# Patient Record
Sex: Female | Born: 1940 | Race: White | Hispanic: No | Smoking: Never smoker
Health system: Southern US, Community
[De-identification: ages and names within clinical notes are randomized; demographics above are authoritative.]

## PROBLEM LIST (undated history)

## (undated) HISTORY — PX: OTHER SURGICAL HISTORY: SHX169

---

## 2020-08-13 ENCOUNTER — Encounter: Payer: Self-pay | Admitting: Emergency Medicine

## 2020-08-13 ENCOUNTER — Other Ambulatory Visit: Payer: Self-pay

## 2020-08-13 ENCOUNTER — Emergency Department
Admission: EM | Admit: 2020-08-13 | Discharge: 2020-08-13 | Disposition: A | Discharge status: Home / Self Care | Payer: Medicare Other | Source: Home / Self Care | Attending: Emergency Medicine | Admitting: Emergency Medicine

## 2020-08-13 DIAGNOSIS — L03011 Cellulitis of right finger: Secondary | ICD-10-CM

## 2020-08-13 MED ORDER — DOXYCYCLINE HYCLATE 100 MG PO CAPS: 100.0000 mg | ORAL_CAPSULE | Freq: Two times a day (BID) | ORAL | 0 refills | Status: DC

## 2020-08-13 NOTE — ED Triage Notes (Signed)
Patient reports unusual nail growth on right middle finger over past month; she has been trimming it and yesterday it became reddened, edematous and excruciatingly painful; took ibuprofen 0200. Has not had covid vaccination.

## 2020-08-13 NOTE — ED Provider Notes (Addendum)
[facility]    CSN: [id number] Arrival date & time: 08/13/20  3579 79 year old female, new patient to [facility] urgent care, presents on Sunday, 08/13/2020     History   Chief Complaint Chief Complaint  Patient presents with  . Nail Problem    HPI Dominique Peterson is a 79 y.o. female.   HPI Patient noted unusual nail growth on right middle finger over the past month, but it was not particularly painful and did not have drainage or bleeding. Over the past week or so, she has been trimming it. Yesterday, noted right middle finger nail area became red and swollen and very painful.  No drainage.  No bleeding. She took ibuprofen early this morning which helped a little bit.  No other symptoms.  Denies fever or chills or nausea or vomiting. Denies history of diabetes. History reviewed. No pertinent past medical history.  There are no problems to display for this patient.   Past Surgical History:  Procedure Laterality Date  . goiter     excision    OB History   No obstetric history on file.      Home Medications    Prior to Admission medications   Medication Sig Start Date End Date Taking? Authorizing Provider  doxycycline (VIBRAMYCIN) 100 MG capsule Take 1 capsule (100 mg total) by mouth 2 (two) times daily. 08/13/20   Lajean Manes, MD    Family History History reviewed. No pertinent family history.  Social History Social History   Tobacco Use  . Smoking status: Never Smoker  . Smokeless tobacco: Never Used  Vaping Use  . Vaping Use: Never used  Substance Use Topics  . Alcohol use: Not on file  . Drug use: Not on file     Allergies   Patient has no known allergies.   Review of Systems Review of Systems  All other systems reviewed and are negative.    Physical Exam Triage Vital Signs ED Triage Vitals  Enc Vitals Group     BP 08/13/20 0833 (!) 162/88     Pulse Rate 08/13/20 0833 89     Resp 08/13/20 0833 16     Temp 08/13/20 0833  98.2 F (36.8 C)     Temp Source 08/13/20 0833 Oral     SpO2 08/13/20 0833 98 %     Weight 08/13/20 0835 170 lb (77.1 kg)     Height 08/13/20 0835 5\' 6"  (1.676 m)     Head Circumference --      Peak Flow --      Pain Score 08/13/20 0834 8     Pain Loc --      Pain Edu? --      Excl. in GC? --    No data found.  Updated Vital Signs BP (!) 162/88 (BP Location: Left Arm)   Pulse 89   Temp 98.2 F (36.8 C) (Oral)   Resp 16   Ht 5\' 6"  (1.676 m)   Wt 77.1 kg   SpO2 98%   BMI 27.44 kg/m   Physical Exam Vitals reviewed.  Constitutional:      General: She is not in acute distress.    Appearance: She is well-developed.  HENT:     Head: Normocephalic and atraumatic.  Eyes:     General: No scleral icterus.    Pupils: Pupils are equal, round, and reactive to light.  Cardiovascular:     Rate and Rhythm: Normal rate and regular rhythm.  Pulmonary:  Effort: Pulmonary effort is normal.  Abdominal:     General: There is no distension.  Musculoskeletal:   Distal aspect of soft tissues of the right third finger are red, swollen, firm and tender.  No fluctuance or drainage or bleeding.  Fingernail intact.  Tendons and range of motion intact.  No red streaks. Good capillary refill distally and neurovascular intact. Skin: Is otherwise within normal limits.  No rash.    General: Skin is warm and dry.  Neurological:     Mental Status: She is alert and oriented to person, place, and time.     Cranial Nerves: No cranial nerve deficit.  Sensation intact. Psychiatric:        Behavior: Behavior normal.    UC Treatments / Results  Labs (all labs ordered are listed, but only abnormal results are displayed) Labs Reviewed - No data to display  EKG   Radiology No results found.  Procedures Procedures (including critical care time)  Medications Ordered in UC Medications - No data to display  Initial Impression / Assessment and Plan / UC Course  I have reviewed the triage vital  signs and the nursing notes.  Pertinent labs & imaging results that were available during my care of the patient were reviewed by me and considered in my medical decision making (see chart for details).      Final Clinical Impressions(s) / UC Diagnoses   Final diagnoses:  Cellulitis of right middle finger  Discussed diagnosis and treatment options and patient agrees with discharge instructions as detailed below. There is no fluctuance or sign of abscess, and therefore no need to attempt incision and drainage at this time.  Explained that if there are any worsening or sign of an abscess, to seek medical care immediately and she might need incision and drainage. I have selected doxycycline as antibiotic with excellent staphylococcal and MRSA coverage.  Also, advised to follow-up with PCP within the next 2 to 3 weeks to have BP rechecked.  Precautions discussed. Red flags discussed. Questions invited and answered. Patient voiced understanding and agreement.    Discharge Instructions     You have an infection of the skin/soft tissues of the right third finger.  It is not at the point where we could lance it because it is only infection and firmness in the tissues.  It has not become a boil or come to ahead yet. Treatment: Sent prescription for doxycycline, strong antibiotic to your pharmacy.  Take as directed. Do soaks of finger and Epson salt/warm water for 10 minutes, 3 times a day. Tylenol or ibuprofen if needed for pain. Follow-up with your PCP within 4 to 5 days for recheck, sooner if worse or new symptoms. If your finger becomes severely worse or it feels like a boil is forming, or if you have fever or chills or nausea or vomiting, go to emergency room or urgent care immediately.     ED Prescriptions    Medication Sig Dispense Auth. Provider   doxycycline (VIBRAMYCIN) 100 MG capsule Take 1 capsule (100 mg total) by mouth 2 (two) times daily. 20 capsule Lajean Manes, MD      PDMP not reviewed this encounter.   Lajean Manes, MD 08/14/20 1448    Lajean Manes, MD 08/14/20 [phone]

## 2020-08-13 NOTE — Discharge Instructions (Signed)
You have an infection of the skin/soft tissues of the right third finger.  It is not at the point where we could lance it because it is only infection and firmness in the tissues.  It has not become a boil or come to ahead yet. Treatment: Sent prescription for doxycycline, strong antibiotic to your pharmacy.  Take as directed. Do soaks of finger and Epson salt/warm water for 10 minutes, 3 times a day. Tylenol or ibuprofen if needed for pain. Follow-up with your PCP within 4 to 5 days for recheck, sooner if worse or new symptoms. If your finger becomes severely worse or it feels like a boil is forming, or if you have fever or chills or nausea or vomiting, go to emergency room or urgent care immediately.

## 2020-08-29 ENCOUNTER — Emergency Department (INDEPENDENT_AMBULATORY_CARE_PROVIDER_SITE_OTHER): Payer: Medicare Other

## 2020-08-29 ENCOUNTER — Encounter: Payer: Self-pay | Admitting: Emergency Medicine

## 2020-08-29 ENCOUNTER — Emergency Department
Admission: EM | Admit: 2020-08-29 | Discharge: 2020-08-29 | Disposition: A | Discharge status: Home / Self Care | Payer: Medicare Other | Source: Home / Self Care | Attending: Family Medicine | Admitting: Family Medicine

## 2020-08-29 DIAGNOSIS — M25541 Pain in joints of right hand: Secondary | ICD-10-CM | POA: Diagnosis not present

## 2020-08-29 DIAGNOSIS — M19041 Primary osteoarthritis, right hand: Secondary | ICD-10-CM | POA: Diagnosis not present

## 2020-08-29 NOTE — ED Triage Notes (Signed)
Pt seen on 8/29 for infection to  r middle finger- finished antibiotics last Tuesday Swelling was going down, but is increasing now Pt took a shower earlier today and noted a blister below nail Tender to touch - slightly red Denies fever or chills NO COVID vaccine

## 2020-08-29 NOTE — ED Provider Notes (Signed)
[facility]    CSN: [id number] Arrival date & time: 08/29/20  1447      History   Chief Complaint No chief complaint on file.   HPI Dominique Peterson is a 79 y.o. female.   Patient was treated for cellulitis of her right middle finger 16 days ago with doxycycline for 10 days.  She reports that swelling and pain decreased but still persist at the distal finger and DIP joint.  She took a shower today and noted a "blister" below her fingernail.  She feels well otherwise and denies fevers, chills, and sweats.  The history is provided by the patient.  Hand Pain This is a recurrent problem. Episode onset: 6 to 8 weeks ago. The problem occurs constantly. The problem has not changed since onset.Exacerbated by: finger movement. Nothing relieves the symptoms. Treatments tried: antibiotic. The treatment provided mild relief.    History reviewed. No pertinent past medical history.  There are no problems to display for this patient.   Past Surgical History:  Procedure Laterality Date  . goiter     excision    OB History   No obstetric history on file.      Home Medications    Prior to Admission medications   Medication Sig Start Date End Date Taking? Authorizing Provider  doxycycline (VIBRAMYCIN) 100 MG capsule Take 1 capsule (100 mg total) by mouth 2 (two) times daily. 08/13/20   Lajean Manes, MD    Family History Family History  Problem Relation Age of Onset  . Heart failure Mother     Social History Social History   Tobacco Use  . Smoking status: Never Smoker  . Smokeless tobacco: Never Used  Vaping Use  . Vaping Use: Never used  Substance Use Topics  . Alcohol use: Never  . Drug use: Never     Allergies   Patient has no known allergies.   Review of Systems Review of Systems  Constitutional: Negative for appetite change, chills, diaphoresis, fatigue and fever.  Musculoskeletal: Positive for joint swelling.  Skin: Positive for color change.  All  other systems reviewed and are negative.    Physical Exam Triage Vital Signs ED Triage Vitals  Enc Vitals Group     BP 08/29/20 1558 (!) 156/79     Pulse Rate 08/29/20 1558 70     Resp 08/29/20 1558 17     Temp 08/29/20 1558 99.6 F (37.6 C)     Temp Source 08/29/20 1558 Oral     SpO2 08/29/20 1558 98 %     Weight 08/29/20 1604 170 lb 3.1 oz (77.2 kg)     Height 08/29/20 1604 5\' 6"  (1.676 m)     Head Circumference --      Peak Flow --      Pain Score 08/29/20 1603 2     Pain Loc --      Pain Edu? --      Excl. in GC? --    No data found.  Updated Vital Signs BP (!) 156/79 (BP Location: Left Arm)   Pulse 70   Temp 99.6 F (37.6 C) (Oral)   Resp 17   Ht 5\' 6"  (1.676 m)   Wt 77.2 kg   SpO2 98%   BMI 27.47 kg/m   Visual Acuity Right Eye Distance:   Left Eye Distance:   Bilateral Distance:    Right Eye Near:   Left Eye Near:    Bilateral Near:     Physical  Exam Vitals and nursing note reviewed.  Constitutional:      General: She is not in acute distress. HENT:     Head: Normocephalic.  Eyes:     Conjunctiva/sclera: Conjunctivae normal.     Pupils: Pupils are equal, round, and reactive to light.  Cardiovascular:     Rate and Rhythm: Normal rate.  Pulmonary:     Effort: Pulmonary effort is normal.  Musculoskeletal:        General: Tenderness present.     Right hand: Swelling, tenderness and bony tenderness present. Decreased range of motion.       Hands:     Comments: Right 3rd finger DIP joint erythematous and tender to palpation.  Decreased range of motion DIP joint.  Distal neurovascular function is intact.   Skin:    General: Skin is warm and dry.  Neurological:     Mental Status: She is alert.      UC Treatments / Results  Labs (all labs ordered are listed, but only abnormal results are displayed) Labs Reviewed - No data to display  EKG   Radiology DG Finger Middle Right  Result Date: 08/29/2020 CLINICAL DATA:  Persistent tenderness  and pain and D IP for 1 month EXAM: RIGHT MIDDLE FINGER 2+V COMPARISON:  None. FINDINGS: Subtle fractures involving the base of the third distal phalanx as well as the head of the middle phalanx albeit with complete effacement of the joint space and additional subcortical lucency and swelling. While this could be posttraumatic edematous changes, septic arthritis/osteomyelitis is not excluded given the surrounding swelling. No soft tissue gas or foreign body. IMPRESSION: Subtle fractures involving the base of the third distal phalanx as well as the head of the middle phalanx albeit Complete effacement of the distal interphalangeal joint space and additional subcortical lucency and swelling, possibly posttraumatic though should exclude features of infection clinically as septic arthritis/osteomyelitis could present similarly. Electronically Signed   By: Kreg Shropshire M.D.   On: 08/29/2020 17:49    Procedures Procedures (including critical care time)  Medications Ordered in UC Medications - No data to display  Initial Impression / Assessment and Plan / UC Course  I have reviewed the triage vital signs and the nursing notes.  Pertinent labs & imaging results that were available during my care of the patient were reviewed by me and considered in my medical decision making (see chart for details).    X-ray right middle finger:  Subtle fractures involving the base of the third distal phalanx as well as the head of the middle phalanx albeit with complete effacement of the joint space and additional subcortical lucency and swelling.  Findings are concerning for possible septic arthritis/osteomyelitis.  Patient declines referral to hand surgeon in [place] and wishes to be further evaluated in [place].  Schedule appointment with Dr. Rodney Langton ([facility]) for further evaluation as soon as possible.    Final Clinical Impressions(s) / UC Diagnoses   Final diagnoses:    Inflammation of joint of finger of right hand     Discharge Instructions     If symptoms become significantly worse during the night or over the weekend, proceed to the local emergency room.     ED Prescriptions    None        Lattie Haw, MD 08/31/20 1147

## 2020-08-29 NOTE — Discharge Instructions (Addendum)
If symptoms become significantly worse during the night or over the weekend, proceed to the local emergency room.  

## 2020-09-04 ENCOUNTER — Ambulatory Visit (INDEPENDENT_AMBULATORY_CARE_PROVIDER_SITE_OTHER): Payer: Medicare Other | Admitting: Sports Medicine

## 2020-09-04 ENCOUNTER — Encounter: Payer: Self-pay | Admitting: Sports Medicine

## 2020-09-04 DIAGNOSIS — M7989 Other specified soft tissue disorders: Secondary | ICD-10-CM | POA: Insufficient documentation

## 2020-09-04 MED ORDER — SULFAMETHOXAZOLE-TRIMETHOPRIM 800-160 MG PO TABS: 1.0000 | ORAL_TABLET | Freq: Two times a day (BID) | ORAL | 0 refills | Status: DC

## 2020-09-04 NOTE — Progress Notes (Signed)
    Procedures performed today:    None.  Independent interpretation of notes and tests performed by another provider:   None.  Brief History, Exam, Impression, and Recommendations:    Dominique Peterson is a pleasant 79yo female who has been having about 3 weeks of swelling at there right middle DIP. It began when she pulled a hangnail. She was initially started on doxycycline for cellulitis. On reevaluation xray demonstrated possible osteomyelitis. We are going to get a CBC, ESR, and CRP to evaluate for osteomyelitis. With the potential for an MRI afterwards if these do not help to confirm the diagnosis. We are also going to give her septra while we work on the diagnosis. We discussed the potential to need to see infecitous deisease for IV antibiotics if osteomyelitis is confrimed.   Marcelino Duster, MS3   ___________________________________________ Gwen Her. Dianah Field, M.D., ABFM., CAQSM. Primary Care and [place] Instructor of [place] [employer] [facility]

## 2020-09-04 NOTE — Assessment & Plan Note (Addendum)
This is a pleasant 79 year old female, for almost a month now she is had swelling in her right 3rd DIP, this started with trying to dig out her cuticle.  She was seen in urgent care twice and given a few courses of antibiotics, ultimately x-rays showed potential destruction of her DIP consistent with osteomyelitis. We do need to confirm this diagnosis so I am going to add CBC, CMP, ESR, CRP. If her inflammatory markers are elevated we will likely proceed with referral to infectious disease and potential MRI with and without contrast of her finger. I am going to start Septra in the meantime as she has already had a few weeks of doxycycline. I did inform her that there is potential for amputation and the need for 6 weeks of IV antibiotics.  Labs look okay in terms of white blood cell count, sed rate and CRP, because of these normal values I am going to pull the trigger for finger MRI with and without contrast looking for osteomyelitis.

## 2020-09-05 LAB — COMPLETE METABOLIC PANEL WITH GFR
AG Ratio: 1.6 (calc) (ref 1.0–2.5)
ALT: 16 U/L (ref 6–29)
AST: 18 U/L (ref 10–35)
Albumin: 4.3 g/dL (ref 3.6–5.1)
Alkaline phosphatase (APISO): 94 U/L (ref 37–153)
BUN: 18 mg/dL (ref 7–25)
CO2: 30 mmol/L (ref 20–32)
Calcium: 9.6 mg/dL (ref 8.6–10.4)
Chloride: 103 mmol/L (ref 98–110)
Creat: 0.89 mg/dL (ref 0.60–0.93)
GFR, Est African American: 72 mL/min/{1.73_m2} (ref >=60)
GFR, Est Non African American: 62 mL/min/{1.73_m2} (ref >=60)
Globulin: 2.7 g/dL (calc) (ref 1.9–3.7)
Glucose, Bld: 99 mg/dL (ref 65–139)
Potassium: 4.3 mmol/L (ref 3.5–5.3)
Sodium: 141 mmol/L (ref 135–146)
Total Bilirubin: 0.4 mg/dL (ref 0.2–1.2)
Total Protein: 7 g/dL (ref 6.1–8.1)

## 2020-09-05 LAB — CBC WITH DIFFERENTIAL/PLATELET
Absolute Monocytes: 483 cells/uL (ref 200–950)
Basophils Absolute: 70 cells/uL (ref 0–200)
Basophils Relative: 1 %
Eosinophils Absolute: 161 cells/uL (ref 15–500)
Eosinophils Relative: 2.3 %
HCT: 38 % (ref 35.0–45.0)
Hemoglobin: 12.8 g/dL (ref 11.7–15.5)
Lymphs Abs: 1624 cells/uL (ref 850–3900)
MCH: 30.3 pg (ref 27.0–33.0)
MCHC: 33.7 g/dL (ref 32.0–36.0)
MCV: 90 fL (ref 80.0–100.0)
MPV: 10.8 fL (ref 7.5–12.5)
Monocytes Relative: 6.9 %
Neutro Abs: 4662 cells/uL (ref 1500–7800)
Neutrophils Relative %: 66.6 %
Platelets: 275 10*3/uL (ref 140–400)
RBC: 4.22 10*6/uL (ref 3.80–5.10)
RDW: 13.4 % (ref 11.0–15.0)
Total Lymphocyte: 23.2 %
WBC: 7 10*3/uL (ref 3.8–10.8)

## 2020-09-05 LAB — C-REACTIVE PROTEIN: CRP: 2.6 mg/L (ref <8.0)

## 2020-09-05 LAB — SEDIMENTATION RATE: Sed Rate: 25 mm/h (ref 0–30)

## 2020-09-05 NOTE — Addendum Note (Signed)
Addended by: Monica Becton on: 09/05/2020 08:41 AM   Modules accepted: Orders

## 2020-09-11 ENCOUNTER — Telehealth: Payer: Self-pay

## 2020-09-11 MED ORDER — TRIAZOLAM 0.25 MG PO TABS: ORAL_TABLET | ORAL | 0 refills | Status: DC

## 2020-09-11 NOTE — Telephone Encounter (Signed)
Done

## 2020-09-11 NOTE — Telephone Encounter (Signed)
Patient needs pre med for MRI.

## 2020-09-12 NOTE — Telephone Encounter (Signed)
Left msg advising RX at pharmacy and that would need driver

## 2020-09-18 ENCOUNTER — Ambulatory Visit (INDEPENDENT_AMBULATORY_CARE_PROVIDER_SITE_OTHER): Payer: Medicare Other

## 2020-09-18 ENCOUNTER — Other Ambulatory Visit: Payer: Self-pay

## 2020-09-18 DIAGNOSIS — L03011 Cellulitis of right finger: Secondary | ICD-10-CM | POA: Diagnosis not present

## 2020-09-18 MED ORDER — GADOBUTROL 1 MMOL/ML IV SOLN
7.5000 mL | Freq: Once | INTRAVENOUS | Status: AC | PRN
  Administered 2020-09-18: 7.5 mL via INTRAVENOUS

## 2020-09-18 NOTE — Addendum Note (Signed)
Addended by: Monica Becton on: 09/18/2020 02:10 PM   Modules accepted: Orders

## 2020-09-21 DIAGNOSIS — M869 Osteomyelitis, unspecified: Secondary | ICD-10-CM | POA: Insufficient documentation

## 2020-09-21 NOTE — Progress Notes (Signed)
[place] for Infectious Disease  Reason for Consult: Osteomyelitis of the finger Referring Provider: Dr Dianah Field  HPI:  Dominique Peterson is Dominique 79 y.o. female who presents to clinic for further evaluation of right finger osteomyelitis.     Patient is Dominique Peterson who presents to clinic today for further evaluation of an infection of her right third finger.  She began having issues with this finger in August when she noted unusual nail growth.  This did not have any particular pain, drainage or bleeding.  She tried to manage by trimming her own nails.  She was seen in urgent care visit on August 29 after noticing redness and swelling and pain for about 1 day.  Her symptoms were mildly relieved with ibuprofen at that time.  She was given doxycycline for 10 days at that time to treat Dominique superficial cellulitis of her right middle finger.  There was no imaging done at that time.  There were no labs done at that time.  She presented again to the same urgent care on September 14 and noted that the swelling and pain had decreased but was still persistent at the distal finger and DIP joint.  At that visit she noted Dominique blister below her fingernail as well.  X-ray completed at that urgent care visit showed findings that were concerning for possible septic arthritis or osteomyelitis.  Patient declined referral to hand surgery in [place] and wished to remain closer to home in [place].  She was seen in the sports medicine clinic on September 20 and was seen by primary care sports medicine provider (Dr. Dianah Field).  Interestingly, her white blood cell count was normal, CMP normal, ESR and CRP also normal.  She was given TMP-SMX at this visit for Dominique 7-day course which she completed.  The TMP-SMX did provide some improvement.  Given her normal lab findings it was arranged for her to have an MRI of her fingers with and without contrast.  The results of this imaging done October 4 were motion  graded, however, there was bone marrow edema within the middle and distal phalanx of the right long finger centered at the distal interphalangeal joint.  Subtle low T1 signal change suspected at the base of the distal phalanx which might reflect acute osteomyelitis.  There was soft tissue edema at the distal aspect of the long finger, but no soft tissue fluid collection.  Lastly, trace synovial fluid associated with the flexor tendon of the long finger suggesting Dominique mild tenosynovitis was noted.  She has been referred to infectious disease for further evaluation and presents today.  Patient's Medications  New Prescriptions   No medications on file  Previous Medications   No medications on file  Modified Medications   No medications on file  Discontinued Medications   SULFAMETHOXAZOLE-TRIMETHOPRIM (BACTRIM DS) 800-160 MG TABLET    Take 1 tablet by mouth 2 (two) times daily.   TRIAZOLAM (HALCION) 0.25 MG TABLET    1-2 tabs PO 2 hours before procedure or imaging.  Do not drive with this medication.      No pertinent past medical history.  Social History   Tobacco Use  . Smoking status: Never Smoker  . Smokeless tobacco: Never Used  Vaping Use  . Vaping Use: Never used  Substance Use Topics  . Alcohol use: Never  . Drug use: Never    Family History  Problem Relation Age of Onset  . Heart failure Mother  No Known Allergies  Review of Systems: Review of Systems  Constitutional: Negative for chills, fever and malaise/fatigue.  Respiratory: Negative for cough and shortness of breath.   Cardiovascular: Negative for chest pain.  Gastrointestinal: Negative for abdominal pain, nausea and vomiting.  Musculoskeletal: Positive for joint pain.  Skin: Negative for rash.  All other systems reviewed and are negative.   Objective: Vitals:   09/22/20 0903  BP: (!) 183/75  Pulse: 67  Temp: 98.2 F (36.8 C)  TempSrc: Oral  Weight: 176 lb (79.8 kg)     Body mass index is 28.41  kg/m.  Physical Exam Vitals reviewed.  Constitutional:      General: She is not in acute distress.    Appearance: Normal appearance.  HENT:     Head: Normocephalic and atraumatic.  Pulmonary:     Effort: Pulmonary effort is normal.  Musculoskeletal:        General: Swelling present.     Comments: Right 3rd finger is swollen at the DIP joint with some mild erythema.  No drainage.  She has decreased ROM at the DIP as well.  No significant pain, more pressure sensation.  Neurological:     Mental Status: She is alert.     Right hand with 3rd digit swelling.     Left hand for comparison  Pertinent Labs and Microbiology: ESR 25, CRP 2.6  CBC Latest Ref Rng & Units 09/04/2020  WBC 3.8 - 10.8 Thousand/uL 7.0  Hemoglobin 11.7 - 15.5 g/dL 12.8  Hematocrit 35 - 45 % 38.0  Platelets 140 - 400 Thousand/uL 275   CMP Latest Ref Rng & Units 09/04/2020  Glucose 65 - 139 mg/dL 99  BUN 7 - 25 mg/dL 18  Creatinine 0.60 - 0.93 mg/dL 0.89  Sodium 135 - 146 mmol/L 141  Potassium 3.5 - 5.3 mmol/L 4.3  Chloride 98 - 110 mmol/L 103  CO2 20 - 32 mmol/L 30  Calcium 8.6 - 10.4 mg/dL 9.6  Total Protein 6.1 - 8.1 g/dL 7.0  Total Bilirubin 0.2 - 1.2 mg/dL 0.4  AST 10 - 35 U/L 18  ALT 6 - 29 U/L 16     Imaging: IMPRESSION: 1. Motion degraded examination. 2. Bone marrow edema within the middle and distal phalanx of the right long finger centered at the distal interphalangeal joint. Subtle low T1 signal changes suspected at the base of the distal phalanx which may reflect acute osteomyelitis. 3. Soft tissue edema at the distal aspect of the long finger. No soft tissue fluid collection. 4. Trace tenosynovial fluid associated with the flexor tendon of the long finger suggesting Dominique mild tenosynovitis.  All pertinent labs/notes reviewed.  All pertinent radiology reports have been reviewed.   Decision making incorporated into the Assessment and Plan.  Assessment and Plan: Finger  osteomyelitis, right ([place]) She has MRI findings of osteomyelitis and tenosynovitis of her right third finger.  Interestingly, her CBC and inflammatory markers are normal.  I am concerned about delay in her care if this is attempted as an outpatient due to the risk this poses to her finger on her dominant hand and have recommended inpatient admission for expedited surgical evaluation and IV antibiotics.  Plan: -- discussed with hospitalist who agrees to admit patient -- also discussed with hand surgery who will see patient when arrives -- if going to the OR soon can likely hold antibiotics until after cultures obtained and then start Vancomycin post-operatively.  She last took TMP-SMX on about 9/27 -- I will  repeat labs here today   Hypertension BP elevated here today without symptoms.  Appears to have been high during previous encounters as well.  Not currently on any anti-hypertensive therapy.  Plan: -- Defer to inpatient management and primary care    Orders Placed This Encounter  Procedures  . Blood culture (routine single)  . Blood culture (routine single)  . CBC w/Diff  . Comp Met (CMET)  . Sedimentation rate  . C-reactive protein  . HgB A1c  . HIV Antibody (routine testing w rflx)      I spent greater than 60 minutes with the patient including greater than 50% of time in face to face counsel of the patient and in coordination of their care.   Raynelle [facility] Infectious [facility] 09/22/2020, 10:33 AM

## 2020-09-22 ENCOUNTER — Ambulatory Visit: Payer: Medicare Other | Admitting: Internal Medicine

## 2020-09-22 ENCOUNTER — Telehealth: Payer: Self-pay

## 2020-09-22 ENCOUNTER — Other Ambulatory Visit: Payer: Self-pay

## 2020-09-22 ENCOUNTER — Observation Stay (HOSPITAL_COMMUNITY)
Admission: RE | Admit: 2020-09-22 | Discharge: 2020-09-23 | Disposition: A | Discharge status: Home / Self Care | Payer: Medicare Other | Attending: Internal Medicine | Admitting: Internal Medicine

## 2020-09-22 ENCOUNTER — Inpatient Hospital Stay (HOSPITAL_COMMUNITY): Payer: Medicare Other

## 2020-09-22 ENCOUNTER — Encounter (HOSPITAL_COMMUNITY): Payer: Self-pay | Admitting: Internal Medicine

## 2020-09-22 ENCOUNTER — Encounter: Payer: Self-pay | Admitting: Internal Medicine

## 2020-09-22 VITALS — BP 183/75 | HR 67 | Temp 98.2°F | Wt 176.0 lb

## 2020-09-22 DIAGNOSIS — M869 Osteomyelitis, unspecified: Secondary | ICD-10-CM | POA: Diagnosis not present

## 2020-09-22 DIAGNOSIS — I10 Essential (primary) hypertension: Secondary | ICD-10-CM

## 2020-09-22 DIAGNOSIS — L089 Local infection of the skin and subcutaneous tissue, unspecified: Secondary | ICD-10-CM | POA: Insufficient documentation

## 2020-09-22 DIAGNOSIS — B999 Unspecified infectious disease: Secondary | ICD-10-CM

## 2020-09-22 DIAGNOSIS — Z20822 Contact with and (suspected) exposure to covid-19: Secondary | ICD-10-CM | POA: Diagnosis not present

## 2020-09-22 DIAGNOSIS — R609 Edema, unspecified: Secondary | ICD-10-CM

## 2020-09-22 DIAGNOSIS — M7989 Other specified soft tissue disorders: Principal | ICD-10-CM | POA: Insufficient documentation

## 2020-09-22 HISTORY — DX: Unspecified infectious disease: B99.9

## 2020-09-22 LAB — RESPIRATORY PANEL BY RT PCR (FLU A&B, COVID)
Influenza A by PCR: NEGATIVE
Influenza B by PCR: NEGATIVE
SARS Coronavirus 2 by RT PCR: NEGATIVE

## 2020-09-22 LAB — BASIC METABOLIC PANEL
Anion gap: 11 (ref 5–15)
BUN: 12 mg/dL (ref 8–23)
CO2: 24 mmol/L (ref 22–32)
Calcium: 9 mg/dL (ref 8.9–10.3)
Chloride: 103 mmol/L (ref 98–111)
Creatinine, Ser: 0.91 mg/dL (ref 0.44–1.00)
GFR, Estimated: >60 mL/min (ref >60)
Glucose, Bld: 101 mg/dL — ABNORMAL HIGH (ref 70–99)
Potassium: 3.6 mmol/L (ref 3.5–5.1)
Sodium: 138 mmol/L (ref 135–145)

## 2020-09-22 LAB — SEDIMENTATION RATE: Sed Rate: 13 mm/hr (ref 0–22)

## 2020-09-22 LAB — C-REACTIVE PROTEIN: CRP: 0.5 mg/dL (ref <1.0)

## 2020-09-22 MED ORDER — POLYETHYLENE GLYCOL 3350 17 G PO PACK: 17.0000 g | PACK | Freq: Every day | ORAL | Status: DC | PRN

## 2020-09-22 MED ORDER — VANCOMYCIN HCL 1500 MG/300ML IV SOLN
1500.0000 mg | Freq: Once | INTRAVENOUS | Status: AC
  Administered 2020-09-22: 1500 mg via INTRAVENOUS
  Filled 2020-09-22: qty 300

## 2020-09-22 MED ORDER — ONDANSETRON HCL 4 MG PO TABS: 4.0000 mg | ORAL_TABLET | Freq: Four times a day (QID) | ORAL | Status: DC | PRN

## 2020-09-22 MED ORDER — SODIUM CHLORIDE 0.9% FLUSH
3.0000 mL | Freq: Two times a day (BID) | INTRAVENOUS | Status: DC
  Administered 2020-09-22 – 2020-09-23 (×2): 3 mL via INTRAVENOUS

## 2020-09-22 MED ORDER — VANCOMYCIN HCL 750 MG/150ML IV SOLN
750.0000 mg | Freq: Two times a day (BID) | INTRAVENOUS | Status: DC
  Administered 2020-09-23: 750 mg via INTRAVENOUS
  Filled 2020-09-22 (×2): qty 150

## 2020-09-22 MED ORDER — HYDROCODONE-ACETAMINOPHEN 5-325 MG PO TABS: 1.0000 | ORAL_TABLET | ORAL | Status: DC | PRN

## 2020-09-22 MED ORDER — ACETAMINOPHEN 650 MG RE SUPP: 650.0000 mg | Freq: Four times a day (QID) | RECTAL | Status: DC | PRN

## 2020-09-22 MED ORDER — ONDANSETRON HCL 4 MG/2ML IJ SOLN: 4.0000 mg | Freq: Four times a day (QID) | INTRAMUSCULAR | Status: DC | PRN

## 2020-09-22 MED ORDER — SODIUM CHLORIDE 0.9 % IV SOLN: 250.0000 mL | INTRAVENOUS | Status: DC | PRN

## 2020-09-22 MED ORDER — LABETALOL HCL 5 MG/ML IV SOLN: 10.0000 mg | INTRAVENOUS | Status: DC | PRN

## 2020-09-22 MED ORDER — ACETAMINOPHEN 325 MG PO TABS: 650.0000 mg | ORAL_TABLET | Freq: Four times a day (QID) | ORAL | Status: DC | PRN

## 2020-09-22 MED ORDER — SODIUM CHLORIDE 0.9% FLUSH: 3.0000 mL | INTRAVENOUS | Status: DC | PRN

## 2020-09-22 NOTE — Progress Notes (Signed)
Pharmacy Antibiotic Note  Dominique Peterson is a 79 y.o. female admitted on 09/22/2020 with osteomyelitis of R third finger (MRI findings of osteo, tenosynovitis).  Pharmacy has been consulted for vancomycin dosing.  WBC 5.3, afebrile, Scr 0.91, CrCl 53.4 ml/min  Plan: Vancomycin 1500 mg IV X 1, followed by vancomycin 750 mg IV Q 12 hrs (goal vancomycin trough level: 15-20 mg/L) Monitor WBC, temp, clinical improvement,cultures, renal function, vancomycin levels  Temp (24hrs), Avg:98.5 F (36.9 C), Min:98.2 F (36.8 C), Max:98.8 F (37.1 C)  Recent Labs  Lab 09/22/20 1044  WBC 5.3    Estimated Creatinine Clearance: 54.6 mL/min (by C-G formula based on SCr of 0.89 mg/dL).    No Known Allergies  Microbiology results: 10/8 BCx X 1: pending  Thank you for allowing pharmacy to be a part of this patient's care.  Vicki Mallet, PharmD, BCPS, [facility] Clinical Pharmacist 09/22/2020 8:53 PM

## 2020-09-22 NOTE — H&P (Signed)
History and Physical    Tinley Rought RRN:[id number] DOB: 05/21/1941 DOA: 09/22/2020  PCP: Patient, No Pcp Per   Patient coming from: Home   Chief Complaint: Right 3rd finger swelling and redness   HPI: Dominique Peterson is a 79 y.o. female who describes herself as generally healthy and does not take any prescription medications, now presenting as a direct admission from the ID clinic for swelling and redness of the third right finger with MRI findings concerning for osteomyelitis and tenosynovitis.  Patient reports that she had some unusual fingernail growth involving the third right finger back in late August, trimmed away this piece of nail at home, and noted some scant bloody and yellow drainage at that time.  This was followed by marked swelling and erythema of the distal digit for which she was seen at an urgent care and given a 10-day course of doxycycline.  She had some improvement in swelling and redness with this but some mild swelling and erythema persisted prompting her to return to urgent care on September 14.  There was concern for possible septic arthritis or osteomyelitis on plain films at that time and the patient followed up with a sports medicine physician on September 20, had normal WBC, CRP, and sed rate at that time, was started on a 7-day course of Bactrim, and underwent MRI on 09/18/2020 with findings concerning for acute osteomyelitis and mild tenosynovitis.  Given these MRI results, she was seen by infectious disease today, and surgery was consulted by ID, and direct admission to [facility] was arranged.  Patient denies any fevers, chills, any recent purulent drainage from the finger, denies any other red or swollen joints, and denies any cough, shortness of breath, or chest pain.   Review of Systems:  All other systems reviewed and apart from HPI, are negative.  Past Medical History:  Diagnosis Date  . Infection 09/22/2020   3rd right finger    Past Surgical  History:  Procedure Laterality Date  . goiter     excision    Social History:   reports that she has never smoked. She has never used smokeless tobacco. She reports that she does not drink alcohol and does not use drugs.  No Known Allergies  Family History  Problem Relation Age of Onset  . Heart failure Mother      Prior to Admission medications   Not on File    Physical Exam: Vitals:   09/22/20 1745  BP: (!) 157/71  Pulse: 70  Resp: 18  Temp: 98.8 F (37.1 C)  TempSrc: Oral    Constitutional: NAD, calm  Eyes: PERTLA, lids and conjunctivae normal ENMT: Mucous membranes are moist. Posterior pharynx clear of any exudate or lesions.   Neck: normal, supple, no masses, no thyromegaly Respiratory:  no wheezing, no crackles. No accessory muscle use.  Cardiovascular: S1 & S2 heard, regular rate and rhythm. No extremity edema.   Abdomen: No distension, no tenderness, soft. Bowel sounds active.  Musculoskeletal: no clubbing / cyanosis. Mild erythema and tenderness involving distal 3rd right finger without fluctuance or drainage.   Skin: no significant rashes, lesions, ulcers. Warm, dry, well-perfused. Neurologic: No facial asymmetry. Sensation intact. Moving all extremities.  Psychiatric: Alert and oriented to person, place, and situation. Very pleasant and cooperative.    Labs and Imaging on Admission: I have personally reviewed following labs and imaging studies  CBC: Recent Labs  Lab 09/22/20 1044  WBC 5.3  NEUTROABS 3,890  HGB 13.2  HCT  39.5  MCV 89.2  PLT 264   Basic Metabolic Panel: No results for input(s): NA, K, CL, CO2, GLUCOSE, BUN, CREATININE, CALCIUM, MG, PHOS in the last 168 hours. GFR: Estimated Creatinine Clearance: 54.6 mL/min (by C-G formula based on SCr of 0.89 mg/dL). Liver Function Tests: No results for input(s): AST, ALT, ALKPHOS, BILITOT, PROT, ALBUMIN in the last 168 hours. No results for input(s): LIPASE, AMYLASE in the last 168 hours. No  results for input(s): AMMONIA in the last 168 hours. Coagulation Profile: No results for input(s): INR, PROTIME in the last 168 hours. Cardiac Enzymes: No results for input(s): CKTOTAL, CKMB, CKMBINDEX, TROPONINI in the last 168 hours. BNP (last 3 results) No results for input(s): PROBNP in the last 8760 hours. HbA1C: No results for input(s): HGBA1C in the last 72 hours. CBG: No results for input(s): GLUCAP in the last 168 hours. Lipid Profile: No results for input(s): CHOL, HDL, LDLCALC, TRIG, CHOLHDL, LDLDIRECT in the last 72 hours. Thyroid Function Tests: No results for input(s): TSH, T4TOTAL, FREET4, T3FREE, THYROIDAB in the last 72 hours. Anemia Panel: No results for input(s): VITAMINB12, FOLATE, FERRITIN, TIBC, IRON, RETICCTPCT in the last 72 hours. Urine analysis: No results found for: COLORURINE, APPEARANCEUR, LABSPEC, PHURINE, GLUCOSEU, HGBUR, BILIRUBINUR, KETONESUR, PROTEINUR, UROBILINOGEN, NITRITE, LEUKOCYTESUR Sepsis Labs: @LABRCNTIP (procalcitonin:4,lacticidven:4) )No results found for this or any previous visit (from the past 240 hour(s)).   Radiological Exams on Admission: No results found.  Assessment/Plan   1. Finger swelling, suspected osteomyelitis  - Patient with persistent swelling and redness of distal 3rd right finger despite 2 courses of outpatient abx with MRI concerning for acute osteomyelitis and tenosynovitis and now presents as direct admission from ID clinic for IV antibiotics and hand surgery consultation  - Culture and start IV vancomycin, keep NPO after midnight in anticipation of potential surgery, trend inflammatory markers, follow cultures, and monitor clinical response to antibiotics    2. Elevated BPs  - Patient denies hx of HTN, does not take any prescription medications, but is hypertensive and appears to have consistently elevated BP during clinic visits  - She is asymptomatic  - Monitor and treat as needed for now     DVT prophylaxis:  SCDs Code Status: Full  Family Communication: Discussed with patient  Disposition Plan:  Patient is from: Home  Anticipated d/c is to: Home  Anticipated d/c date is: 09/25/20 Patient currently: pending surgical consultation  Consults called: Hand surgery consulted by ID  Admission status: Inpatient     11/25/20, MD [facility]  09/22/2020, 8:51 PM

## 2020-09-22 NOTE — Patient Instructions (Signed)
We will admit you to the hospital for surgery evaluation and antibiotics.

## 2020-09-22 NOTE — Consult Note (Signed)
Reason for Consult: Right middle finger pain and swelling Referring Physician: Infectious disease  Dominique Peterson is an 79 y.o. female.  HPI: 79 year old female who presents with pain and swelling in her middle finger DIP joint.  She gives an interesting presentation in that her pain began sometime late in August.  She was seen through primary care by different providers and ultimately Dr. Karie Schwalbe saw her and reviewed MRI scan which had been ordered.  I reviewed her MRI scan and radiographs.  The initial radiograph questioned whether there was fragmentation and a fracture.  The diagnosis of osteomyelitis was also brought up by the radiologist and the MRI report and the x-ray report.  Interestingly her erythrocyte sedimentation rate and her C-reactive protein drawn in  late September were normal.  Her repeat sedimentation rate today is actually less than it was in September.  She is here today with her daughter who lives in [place].  I reviewed the presentation and the patient's concerns.  She was admitted as there was a concern of osteomyelitis.  I was asked to see and evaluate as well.  She denies other issues.  She denies fever chills nausea vomiting or systemic symptoms.  She states that she has not particularly worsened but still continues to have pain in the finger.  Past Medical History:  Diagnosis Date  . Infection 09/22/2020   3rd right finger    Past Surgical History:  Procedure Laterality Date  . goiter     excision    Family History  Problem Relation Age of Onset  . Heart failure Mother     Social History:  reports that she has never smoked. She has never used smokeless tobacco. She reports that she does not drink alcohol and does not use drugs.  Allergies: No Known Allergies  Medications: I have reviewed the patient's current medications.  Results for orders placed or performed in visit on 09/22/20 (from the past 48 hour(s))  CBC w/Diff     Status: None    Collection Time: 09/22/20 10:44 AM  Result Value Ref Range   WBC 5.3 3.8 - 10.8 Thousand/uL   RBC 4.43 3.80 - 5.10 Million/uL   Hemoglobin 13.2 11.7 - 15.5 g/dL   HCT 94.1 35 - 45 %   MCV 89.2 80.0 - 100.0 fL   MCH 29.8 27.0 - 33.0 pg   MCHC 33.4 32.0 - 36.0 g/dL   RDW 74.0 81.4 - 48.1 %   Platelets 264 140 - 400 Thousand/uL   MPV 10.6 7.5 - 12.5 fL   Neutro Abs 3,890 1,500 - 7,800 cells/uL   Lymphs Abs 970 850 - 3,900 cells/uL   Absolute Monocytes 302 200 - 950 cells/uL   Eosinophils Absolute 80 15 - 500 cells/uL   Basophils Absolute 58 0 - 200 cells/uL   Neutrophils Relative % 73.4 %   Total Lymphocyte 18.3 %   Monocytes Relative 5.7 %   Eosinophils Relative 1.5 %   Basophils Relative 1.1 %  Sedimentation rate     Status: None   Collection Time: 09/22/20 10:44 AM  Result Value Ref Range   Sed Rate 17 0 - 30 mm/h    No results found.  Review of Systems  Respiratory: Negative.   Cardiovascular: Negative.   Gastrointestinal: Negative.   Endocrine: Negative.   Genitourinary: Negative.    Blood pressure (!) 157/71, pulse 70, temperature 98.8 F (37.1 C), temperature source Oral, resp. rate 18. Physical Exam examination reveals some rubor about the  right middle finger DIP joint.  With active and passive motion she has some crepitance in the joint indicative of cartilaginous loss.  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  There is no obvious fluctuance.  There is not any flexor or extensor tenosynovitis of an infectious nature.  When I squeeze the DIP joint she is not overly problematic and I do not feel a palpable abscess in the area either.  She has intact refill.  I reviewed these issues with her at length.  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  As this is completely about the nail in terms of the linear abnormality this  means it is been present for quite some time.  We typically see this with arthritis and mucous cysts.  The patient does relate the issue of clipping her nail and pulling on the lateral nail fold which seemed to incite the problems but I do not see any paronychia or felon type process.  Opposite extremity is neurovascularly intact.  She stable ligamentous exam about the PIP and MCP joint.  There is no cellulitic change or erythema that is ascending.  This appears to be localized to the DIP region.  I reviewed this with she and her family at length  Lower extremity examination is benign.  Chest is clear she has nonlabored breathing.  Opposite elbow and shoulder examination is benign.  Assessment/Plan: Right middle finger distal interphalangeal joint pain over the course of the last 6 to 8 weeks.  I discussed with the patient my findings.  I have certainly seen this type of situation in patients who have erosive arthritis in particular joints.  They represent a clinical picture of synovitis and joint destruction.  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  The patient certainly has in her differential infection.  I feel that considerations include infection but certainly her laboratory analysis would argue against this.  Also she does not appear to have a full expression of worsening per se.  Occasionally active synovitis in the DIP joint secondary to erosive arthritis can cause redness and this type of swelling.  This occasionally can burnout and become stable.  Of course this could also represent infection but the labs as mentioned above would argue against this.  MRI scans in the face of bone inflammation can represent a host of different things including arthritis.  Oftentimes it is difficult to discern between fracture, arthritis of a aggressive nature and infection with an MRI scan given the sensitivity of the bony edema on the scans.  In this  situation 1 could simply treat her presumptively for an infection with a proper antibiotic perhaps oral over a 6-week course or certainly if there was overwhelming/compelling evidence 1 could get a biopsy if absolutely desired by infectious disease.  Nevertheless, without a discrete abscess this would be an exercise in a bone biopsy and awaiting the cultures..  I discussed these issues with the patient and her daughter.  Certainly it is a conundrum based upon the MRI reports.  I am happy to obtain a surgical biopsy but in the face of no abscess it is not a absolute.  Certainly the MRI and labs as well as evolving clinical picture are best measures to follow this issue.  I will request repeat hand x-rays.  We will await infectious disease  to weigh in on their recommendations and I will be happy to continue to follow her of course.  At present time I would not rush to a biopsy unless consultants and admitting physicians ask for definitive tissue  Appreciate the opportunity to be involved in the care of this patient    Dominica Severin MD  Oletta Cohn III 09/22/2020, 10:00 PM

## 2020-09-22 NOTE — Assessment & Plan Note (Addendum)
She has MRI findings of osteomyelitis and tenosynovitis of her right third finger.  Interestingly, her CBC and inflammatory markers are normal.  I am concerned about delay in her care if this is attempted as an outpatient due to the risk this poses to her finger on her dominant hand and have recommended inpatient admission for expedited surgical evaluation and IV antibiotics.  Plan: -- discussed with hospitalist who agrees to admit patient -- also discussed with hand surgery who will see patient when arrives -- if going to the OR soon can likely hold antibiotics until after cultures obtained and then start Vancomycin post-operatively.  She last took TMP-SMX on about 9/27 -- I will repeat labs here today

## 2020-09-22 NOTE — Telephone Encounter (Signed)
Spoke with Dominique Peterson with [name] admiting  at 10:34 am. Patient will be admitted with a med surg bed. Patient advised she could go home and wait to be called when a bed is available. Patient requested her daughter Dominique Peterson's cell phone be called [phone]) when bed is ready. Patient's daughters phone has been given to [place] Admission  Dominique Peterson [name]

## 2020-09-22 NOTE — Assessment & Plan Note (Signed)
BP elevated here today without symptoms.  Appears to have been high during previous encounters as well.  Not currently on any anti-hypertensive therapy.  Plan: -- Defer to inpatient management and primary care

## 2020-09-23 DIAGNOSIS — M869 Osteomyelitis, unspecified: Secondary | ICD-10-CM | POA: Diagnosis present

## 2020-09-23 DIAGNOSIS — M7989 Other specified soft tissue disorders: Secondary | ICD-10-CM | POA: Diagnosis not present

## 2020-09-23 DIAGNOSIS — I10 Essential (primary) hypertension: Secondary | ICD-10-CM | POA: Diagnosis not present

## 2020-09-23 LAB — BASIC METABOLIC PANEL
Anion gap: 9 (ref 5–15)
BUN: 13 mg/dL (ref 8–23)
CO2: 27 mmol/L (ref 22–32)
Calcium: 8.7 mg/dL — ABNORMAL LOW (ref 8.9–10.3)
Chloride: 104 mmol/L (ref 98–111)
Creatinine, Ser: 0.78 mg/dL (ref 0.44–1.00)
GFR, Estimated: >60 mL/min (ref >60)
Glucose, Bld: 93 mg/dL (ref 70–99)
Potassium: 3.7 mmol/L (ref 3.5–5.1)
Sodium: 140 mmol/L (ref 135–145)

## 2020-09-23 LAB — CBC
HCT: 35.6 % — ABNORMAL LOW (ref 36.0–46.0)
Hemoglobin: 11.1 g/dL — ABNORMAL LOW (ref 12.0–15.0)
MCH: 28.8 pg (ref 26.0–34.0)
MCHC: 31.2 g/dL (ref 30.0–36.0)
MCV: 92.5 fL (ref 80.0–100.0)
Platelets: 220 10*3/uL (ref 150–400)
RBC: 3.85 MIL/uL — ABNORMAL LOW (ref 3.87–5.11)
RDW: 13.7 % (ref 11.5–15.5)
WBC: 6.1 10*3/uL (ref 4.0–10.5)
nRBC: 0 % (ref 0.0–0.2)

## 2020-09-23 LAB — SEDIMENTATION RATE: Sed Rate: 12 mm/hr (ref 0–22)

## 2020-09-23 LAB — C-REACTIVE PROTEIN: CRP: 0.5 mg/dL (ref <1.0)

## 2020-09-23 NOTE — Discharge Summary (Signed)
Physician Discharge Summary  Dominique Peterson YQM:[id number] DOB: 25-Sep-1941 DOA: 09/22/2020  PCP: Patient, No Pcp Per  Admit date: 09/22/2020 Discharge date: 09/23/2020  Admitted From: Home Disposition: Home  Recommendations for Outpatient Follow-up:  1. Follow up with PCP in 1 week 2. Outpatient follow-up with hand surgery and ID 3. Follow up in ED if symptoms worsen or new appear   Home Health: No Equipment/Devices: None  Discharge Condition: Stable CODE STATUS: Full Diet recommendation: Heart healthy  Brief/Interim Summary: 79 year old female who does not take any prescription medications presented as a direct admit from ID clinic for worsening right third finger swelling and redness with MRI findings concerning for osteomyelitis and tenosynovitis.  She was started on IV vancomycin.  Hand surgery was consulted.  Hand surgery recommended conservative management and ID evaluation.  ID has evaluated the patient and spoken to hand surgery and think that this is noninfectious and patient does not need any IV antibiotics.  They have cleared the patient for discharge and patient was to be discharged home today.  Discharge home today.  Discharge Diagnoses:  Right finger swelling, suspected osteomyelitis -Patient with persistent swelling and redness of distal 3rd right finger despite 2 courses of outpatient abx with MRI concerning for acute osteomyelitis and tenosynovitis -Has been started on IV vancomycin. -Hand surgery evaluation appreciated and recommended ID evaluation.  ID has evaluated the patient and spoken to hand surgery and think that this is noninfectious and patient does not need any IV antibiotics.  They have cleared the patient for discharge and patient was to be discharged home today.  Discharge home today. -Continue anti-inflammatory/ibuprofen as needed. -Blood cultures negative so far.  Afebrile with no leukocytosis.  CRP 0.5.  Hypertension -Patient denies any history of  hypertension and does not take any medications.  Blood pressure elevated since admission.   Patient does not want to start antihypertensives at this time.  Outpatient follow-up with PCP.  Patient does not have a PCP.  Encouraged patient to have regular follow-ups with PCP.  Discharge Instructions  Discharge Instructions    Ambulatory referral to Infectious Disease   Complete by: As directed    Hospital followup   Diet general   Complete by: As directed    Increase activity slowly   Complete by: As directed      Allergies as of 09/23/2020   No Known Allergies     Medication List    TAKE these medications   acetaminophen 325 MG tablet Commonly known as: TYLENOL Take 325-650 mg by mouth every 6 (six) hours as needed (for pain).   ibuprofen 200 MG tablet Commonly known as: ADVIL Take 400 mg by mouth every 6 (six) hours as needed (for pain).       Follow-up Information    PCP. Schedule an appointment as soon as possible for a visit in 1 week(s).        Dominica Severin, MD. Schedule an appointment as soon as possible for a visit in 1 week(s).   Specialty: Orthopedic Surgery Contact information: [address] [place] [address] [place] [address] [phone]              No Known Allergies  Consultations:  ID/hand surgery   Procedures/Studies: MR FINGERS RIGHT W WO CONTRAST  Result Date: 09/18/2020 CLINICAL DATA:  Paronychia of the right middle finger. Status post antibiotics. Persistent pain and swelling. EXAM: MRI OF THE RIGHT FINGERS WITHOUT AND WITH CONTRAST TECHNIQUE: Multiplanar, multisequence MR imaging of the right long finger  was performed before and after the administration of intravenous contrast. CONTRAST:  7.115mL GADAVIST GADOBUTROL 1 MMOL/ML IV SOLN COMPARISON:  X-ray 08/29/2020 FINDINGS: Technical note: Examination degraded by patient motion artifact. Bones/Joint/Cartilage Bone marrow edema within the middle and distal phalanx of the right long finger  centered at the distal interphalangeal joint. Subtle low T1 signal changes are suspected at the base of the distal phalanx (series 9, image 11; series 6, image 6), evaluation of which is degraded by motion artifact. There is joint space narrowing of the long finger DIP joint. No appreciable joint effusion. No definite fracture. No dislocation. Incidental note of a probable intraosseous ganglion within the proximal phalanx of the index finger near the PIP joint. Ligaments Collateral ligaments appear intact. Muscles and Tendons Trace tenosynovial fluid associated with the flexor tendon of the long finger. Flexor and extensor tendons intact. Soft tissues Soft tissue edema noted at the distal aspect of the long finger. No soft tissue fluid collection. No subungual fluid collection. IMPRESSION: 1. Motion degraded examination. 2. Bone marrow edema within the middle and distal phalanx of the right long finger centered at the distal interphalangeal joint. Subtle low T1 signal changes suspected at the base of the distal phalanx which may reflect acute osteomyelitis. 3. Soft tissue edema at the distal aspect of the long finger. No soft tissue fluid collection. 4. Trace tenosynovial fluid associated with the flexor tendon of the long finger suggesting a mild tenosynovitis. Electronically Signed   By: Duanne GuessNicholas  Plundo D.O.   On: 09/18/2020 12:12   DG Finger Middle Right  Addendum Date: 09/22/2020   ADDENDUM REPORT: 09/22/2020 23:31 ADDENDUM: Transcription error: Impression should read Possible fractures involving the base of the third distal phalanx and head of the third middle phalanx although given effacement of the distal interphalangeal joint space and subcortical lucency with surrounding swelling should exclude clinical features of infection as septic arthritis/osteomyelitis or an inflammatory process could present similarly. Electronically Signed   By: Kreg ShropshirePrice  DeHay M.D.   On: 09/22/2020 23:31   Result Date:  09/22/2020 CLINICAL DATA:  Persistent tenderness and pain and D IP for 1 month EXAM: RIGHT MIDDLE FINGER 2+V COMPARISON:  None. FINDINGS: Subtle fractures involving the base of the third distal phalanx as well as the head of the middle phalanx albeit with complete effacement of the joint space and additional subcortical lucency and swelling. While this could be posttraumatic edematous changes, septic arthritis/osteomyelitis is not excluded given the surrounding swelling. No soft tissue gas or foreign body. IMPRESSION: Subtle fractures involving the base of the third distal phalanx as well as the head of the middle phalanx albeit Complete effacement of the distal interphalangeal joint space and additional subcortical lucency and swelling, possibly posttraumatic though should exclude features of infection clinically as septic arthritis/osteomyelitis could present similarly. Electronically Signed: By: Kreg ShropshirePrice  DeHay M.D. On: 08/29/2020 17:49   DG Finger Middle Right  Result Date: 09/22/2020 CLINICAL DATA:  79 year old female with swelling of the third digit of the right hand. EXAM: RIGHT MIDDLE FINGER 2+V COMPARISON:  Radiograph dated 08/29/2020. FINDINGS: No acute fracture. The bones are osteopenic. There is no dislocation. There is loss of DIP joint space likely infectious or inflammatory in etiology. There is irregularity of the lateral aspect of the the IP joint. Overall the appearance is similar to prior radiograph. There is mild soft tissue swelling. No radiopaque foreign object or soft tissue gas. IMPRESSION: 1. No acute fracture or dislocation. 2. Arthritic changes of the DIP joints. 3.  Mild soft tissue swelling of the third digit. Electronically Signed   By: Elgie Collard M.D.   On: 09/22/2020 23:30       Subjective: Patient seen and examined at bedside.  Denies worsening finger pain.  No overnight fever, vomiting or shortness of breath reported.  Discharge Exam: Vitals:   09/23/20 0611  09/23/20 1041  BP: (!) 129/50 (!) 156/63  Pulse: (!) 58 (!) 55  Resp: 16 18  Temp: 97.7 F (36.5 C) 98.5 F (36.9 C)  SpO2: 96% 100%    General exam: Appears calm and comfortable  Respiratory system: Bilateral decreased breath sounds at bases Cardiovascular system: S1 & S2 heard, bradycardic Gastrointestinal system: Abdomen is nondistended, soft and nontender. Normal bowel sounds heard. Extremities: No cyanosis, clubbing, edema  Musculoskeletal: Mild erythema and tenderness involving distal third right finger without fluctuance or drainage.    The results of significant diagnostics from this hospitalization (including imaging, microbiology, ancillary and laboratory) are listed below for reference.     Microbiology: Recent Results (from the past 240 hour(s))  Respiratory Panel by RT PCR (Flu A&B, Covid) - Nasopharyngeal Swab     Status: None   Collection Time: 09/22/20 10:46 PM   Specimen: Nasopharyngeal Swab  Result Value Ref Range Status   SARS Coronavirus 2 by RT PCR NEGATIVE NEGATIVE Final    Comment: (NOTE) SARS-CoV-2 target nucleic acids are NOT DETECTED.  The SARS-CoV-2 RNA is generally detectable in upper respiratoy specimens during the acute phase of infection. The lowest concentration of SARS-CoV-2 viral copies this assay can detect is 131 copies/mL. A negative result does not preclude SARS-Cov-2 infection and should not be used as the sole basis for treatment or other patient management decisions. A negative result may occur with  improper specimen collection/handling, submission of specimen other than nasopharyngeal swab, presence of viral mutation(s) within the areas targeted by this assay, and inadequate number of viral copies (<131 copies/mL). A negative result must be combined with clinical observations, patient history, and epidemiological information. The expected result is Negative.  Fact Sheet for Patients:   https://www.moore.com/  Fact Sheet for Healthcare Providers:  https://www.young.biz/  This test is no t yet approved or cleared by the [place] FDA and  has been authorized for detection and/or diagnosis of SARS-CoV-2 by FDA under an Emergency Use Authorization (EUA). This EUA will remain  in effect (meaning this test can be used) for the duration of the COVID-19 declaration under Section 564(b)(1) of the Act, 21 U.S.C. section 360bbb-3(b)(1), unless the authorization is terminated or revoked sooner.     Influenza A by PCR NEGATIVE NEGATIVE Final   Influenza B by PCR NEGATIVE NEGATIVE Final    Comment: (NOTE) The Xpert Xpress SARS-CoV-2/FLU/RSV assay is intended as an aid in  the diagnosis of influenza from Nasopharyngeal swab specimens and  should not be used as a sole basis for treatment. Nasal washings and  aspirates are unacceptable for Xpert Xpress SARS-CoV-2/FLU/RSV  testing.  Fact Sheet for Patients: https://www.moore.com/  Fact Sheet for Healthcare Providers: https://www.young.biz/  This test is not yet approved or cleared by the [place] FDA and  has been authorized for detection and/or diagnosis of SARS-CoV-2 by  FDA under an Emergency Use Authorization (EUA). This EUA will remain  in effect (meaning this test can be used) for the duration of the  Covid-19 declaration under Section 564(b)(1) of the Act, 21  U.S.C. section 360bbb-3(b)(1), unless the authorization is  terminated or revoked. Performed at [facility], [address], [place], [place] [address]      Labs: BNP (last 3 results) No results for input(s): BNP in the last 8760 hours. Basic Metabolic Panel: Recent Labs  Lab 09/22/20 1044 09/22/20 2146 09/23/20 0224  NA 140 138 140  K 4.4 3.6 3.7  CL 102 103 104  CO2 GLUCOSE 102* 101* 93  BUN CREATININE 0.76 0.91 0.78  CALCIUM 9.6  9.0 8.7*   Liver Function Tests: Recent Labs  Lab 09/22/20 1044  AST 25  ALT 28  BILITOT 0.5  PROT 7.4   No results for input(s): LIPASE, AMYLASE in the last 168 hours. No results for input(s): AMMONIA in the last 168 hours. CBC: Recent Labs  Lab 09/22/20 1044 09/23/20 0224  WBC 5.3 6.1  NEUTROABS 3,890  --   HGB 13.2 11.1*  HCT 39.5 35.6*  MCV 89.2 92.5  PLT 264 220   Cardiac Enzymes: No results for input(s): CKTOTAL, CKMB, CKMBINDEX, TROPONINI in the last 168 hours. BNP: Invalid input(s): POCBNP CBG: No results for input(s): GLUCAP in the last 168 hours. D-Dimer No results for input(s): DDIMER in the last 72 hours. Hgb A1c Recent Labs    09/22/20 1044  HGBA1C 5.5   Lipid Profile No results for input(s): CHOL, HDL, LDLCALC, TRIG, CHOLHDL, LDLDIRECT in the last 72 hours. Thyroid function studies No results for input(s): TSH, T4TOTAL, T3FREE, THYROIDAB in the last 72 hours.  Invalid input(s): FREET3 Anemia work up No results for input(s): VITAMINB12, FOLATE, FERRITIN, TIBC, IRON, RETICCTPCT in the last 72 hours. Urinalysis No results found for: COLORURINE, APPEARANCEUR, LABSPEC, PHURINE, GLUCOSEU, HGBUR, BILIRUBINUR, KETONESUR, PROTEINUR, UROBILINOGEN, NITRITE, LEUKOCYTESUR Sepsis Labs Invalid input(s): PROCALCITONIN,  WBC,  LACTICIDVEN Microbiology Recent Results (from the past 240 hour(s))  Respiratory Panel by RT PCR (Flu A&B, Covid) - Nasopharyngeal Swab     Status: None   Collection Time: 09/22/20 10:46 PM   Specimen: Nasopharyngeal Swab  Result Value Ref Range Status   SARS Coronavirus 2 by RT PCR NEGATIVE NEGATIVE Final    Comment: (NOTE) SARS-CoV-2 target nucleic acids are NOT DETECTED.  The SARS-CoV-2 RNA is generally detectable in upper respiratoy specimens during the acute phase of infection. The lowest concentration of SARS-CoV-2 viral copies this assay can detect is 131 copies/mL. A negative result does not preclude SARS-Cov-2 infection and  should not be used as the sole basis for treatment or other patient management decisions. A negative result may occur with  improper specimen collection/handling, submission of specimen other than nasopharyngeal swab, presence of viral mutation(s) within the areas targeted by this assay, and inadequate number of viral copies (<131 copies/mL). A negative result must be combined with clinical observations, patient history, and epidemiological information. The expected result is Negative.  Fact Sheet for Patients:  https://www.moore.com/  Fact Sheet for Healthcare Providers:  https://www.young.biz/  This test is no t yet approved or cleared by the [place] FDA and  has been authorized for detection and/or diagnosis of SARS-CoV-2 by FDA under an Emergency Use Authorization (EUA). This EUA will remain  in effect (meaning this test can be used) for the duration of the COVID-19 declaration under Section 564(b)(1) of the Act, 21 U.S.C. section 360bbb-3(b)(1), unless the authorization is terminated or revoked sooner.     Influenza A by PCR NEGATIVE NEGATIVE Final   Influenza B by PCR NEGATIVE NEGATIVE Final    Comment: (NOTE) The Xpert Xpress SARS-CoV-2/FLU/RSV assay is intended as  an aid in  the diagnosis of influenza from Nasopharyngeal swab specimens and  should not be used as a sole basis for treatment. Nasal washings and  aspirates are unacceptable for Xpert Xpress SARS-CoV-2/FLU/RSV  testing.  Fact Sheet for Patients: https://www.moore.com/  Fact Sheet for Healthcare Providers: https://www.young.biz/  This test is not yet approved or cleared by the [place] FDA and  has been authorized for detection and/or diagnosis of SARS-CoV-2 by  FDA under an Emergency Use Authorization (EUA). This EUA will remain  in effect (meaning this test can be used) for the duration of the  Covid-19 declaration  under Section 564(b)(1) of the Act, 21  U.S.C. section 360bbb-3(b)(1), unless the authorization is  terminated or revoked. Performed at [facility], [address] [place], [place] [address]      Time coordinating discharge: 35 minutes  SIGNED:   Glade Lloyd, MD  [facility] 09/23/2020, 2:59 PM

## 2020-09-23 NOTE — Care Management Obs Status (Signed)
MEDICARE OBSERVATION STATUS NOTIFICATION   Patient Details  Name: Dominique Peterson MRN: [id number] Date of Birth: 07/26/1941   Medicare Observation Status Notification Given:  Yes    Oliveras-Aizpurua, Katalyna Socarras, RN 09/23/2020, 3:17 PM 

## 2020-09-23 NOTE — Progress Notes (Signed)
Patient ID: Dominique Peterson, female   DOB: Aug 03, 1941, 79 y.o.   MRN: [id number]  PROGRESS NOTE    Dominique Peterson  DTO:[id number] DOB: [birth year] DOA: 09/22/2020 PCP: Patient, No Pcp Per   Brief Narrative:  79 year old female who does not take any prescription medications presented as a direct admit from ID clinic for worsening right third finger swelling and redness with MRI findings concerning for osteomyelitis and tenosynovitis.  She was started on IV vancomycin.  Hand surgery was consulted.  Assessment & Plan:   Right finger swelling, suspected osteomyelitis -Patient with persistent swelling and redness of distal 3rd right finger despite 2 courses of outpatient abx with MRI concerning for acute osteomyelitis and tenosynovitis -Has been started on IV vancomycin. -Hand surgery evaluation appreciated.  Wants to wait for further ID recommendations before proceeding with biopsy/surgical intervention -Blood cultures negative so far.  Afebrile with no leukocytosis.  CRP 0.5.  Hypertension -Patient denies any history of hypertension and does not take any medications.  Blood pressure elevated since admission.  Monitor.  Might have to start antihypertensives if persistently elevated.  Patient does not want to start antihypertensives at this time.  DVT prophylaxis: SCDs Code Status: Full Family Communication: None at bedside Disposition Plan: Status is: Inpatient  Remains inpatient appropriate because:Inpatient level of care appropriate due to severity of illness   Dispo: The patient is from: Home              Anticipated d/c is to: Home              Anticipated d/c date is: 2 days              Patient currently is not medically stable to d/c.   Consultants: Hand surgery.  ID evaluation pending  Procedures: None  Antimicrobials: Vancomycin from 09/22/2020 onwards   Subjective: Patient seen and examined at bedside.  Denies worsening finger pain.  No overnight fever, vomiting or shortness of  breath reported.  Objective: Vitals:   09/22/20 2026 09/22/20 2235 09/23/20 0234 09/23/20 0611  BP:  (!) 157/61 (!) 141/49 (!) 129/50  Pulse:  (!) 55 (!) 56 (!) 58  Resp:  16 14 16   Temp:  98.6 F (37 C) 97.7 F (36.5 C) 97.7 F (36.5 C)  TempSrc:  Oral Oral Oral  SpO2:  96% 96% 96%  Weight: 78.6 kg       Intake/Output Summary (Last 24 hours) at 09/23/2020 0803 Last data filed at 09/23/2020 0500 Gross per 24 hour  Intake 300 ml  Output --  Net 300 ml   Filed Weights   09/22/20 2026  Weight: 78.6 kg    Examination:  General exam: Appears calm and comfortable  Respiratory system: Bilateral decreased breath sounds at bases Cardiovascular system: S1 & S2 heard, bradycardic Gastrointestinal system: Abdomen is nondistended, soft and nontender. Normal bowel sounds heard. Extremities: No cyanosis, clubbing, edema  Musculoskeletal: Mild erythema and tenderness involving distal third right finger without fluctuance or drainage. Central nervous system: Alert and oriented. No focal neurological deficits. Moving extremities Skin: No rashes, lesions or ulcers Psychiatry: Judgement and insight appear normal. Mood & affect appropriate.     Data Reviewed: I have personally reviewed following labs and imaging studies  CBC: Recent Labs  Lab 09/22/20 1044 09/23/20 0224  WBC 5.3 6.1  NEUTROABS 3,890  --   HGB 13.2 11.1*  HCT 39.5 35.6*  MCV 89.2 92.5  PLT 264 220   Basic Metabolic Panel: Recent Labs  Lab  09/22/20 1044 09/22/20 2146 09/23/20 0224  NA 140 138 140  K 4.4 3.6 3.7  CL 102 103 104  CO2 28 24 27   GLUCOSE 102* 101* 93  BUN 13 12 13   CREATININE 0.76 0.91 0.78  CALCIUM 9.6 9.0 8.7*   GFR: Estimated Creatinine Clearance: 60.3 mL/min (by C-G formula based on SCr of 0.78 mg/dL). Liver Function Tests: Recent Labs  Lab 09/22/20 1044  AST 25  ALT 28  BILITOT 0.5  PROT 7.4   No results for input(s): LIPASE, AMYLASE in the last 168 hours. No results for  input(s): AMMONIA in the last 168 hours. Coagulation Profile: No results for input(s): INR, PROTIME in the last 168 hours. Cardiac Enzymes: No results for input(s): CKTOTAL, CKMB, CKMBINDEX, TROPONINI in the last 168 hours. BNP (last 3 results) No results for input(s): PROBNP in the last 8760 hours. HbA1C: Recent Labs    09/22/20 1044  HGBA1C 5.5   CBG: No results for input(s): GLUCAP in the last 168 hours. Lipid Profile: No results for input(s): CHOL, HDL, LDLCALC, TRIG, CHOLHDL, LDLDIRECT in the last 72 hours. Thyroid Function Tests: No results for input(s): TSH, T4TOTAL, FREET4, T3FREE, THYROIDAB in the last 72 hours. Anemia Panel: No results for input(s): VITAMINB12, FOLATE, FERRITIN, TIBC, IRON, RETICCTPCT in the last 72 hours. Sepsis Labs: No results for input(s): PROCALCITON, LATICACIDVEN in the last 168 hours.  Recent Results (from the past 240 hour(s))  Respiratory Panel by RT PCR (Flu A&B, Covid) - Nasopharyngeal Swab     Status: None   Collection Time: 09/22/20 10:46 PM   Specimen: Nasopharyngeal Swab  Result Value Ref Range Status   SARS Coronavirus 2 by RT PCR NEGATIVE NEGATIVE Final    Comment: (NOTE) SARS-CoV-2 target nucleic acids are NOT DETECTED.  The SARS-CoV-2 RNA is generally detectable in upper respiratoy specimens during the acute phase of infection. The lowest concentration of SARS-CoV-2 viral copies this assay can detect is 131 copies/mL. A negative result does not preclude SARS-Cov-2 infection and should not be used as the sole basis for treatment or other patient management decisions. A negative result may occur with  improper specimen collection/handling, submission of specimen other than nasopharyngeal swab, presence of viral mutation(s) within the areas targeted by this assay, and inadequate number of viral copies (<131 copies/mL). A negative result must be combined with clinical observations, patient history, and epidemiological information.  The expected result is Negative.  Fact Sheet for Patients:  11/22/20  Fact Sheet for Healthcare Providers:  11/22/20  This test is no t yet approved or cleared by the https://www.moore.com/ FDA and  has been authorized for detection and/or diagnosis of SARS-CoV-2 by FDA under an Emergency Use Authorization (EUA). This EUA will remain  in effect (meaning this test can be used) for the duration of the COVID-19 declaration under Section 564(b)(1) of the Act, 21 U.S.C. section 360bbb-3(b)(1), unless the authorization is terminated or revoked sooner.     Influenza A by PCR NEGATIVE NEGATIVE Final   Influenza B by PCR NEGATIVE NEGATIVE Final    Comment: (NOTE) The Xpert Xpress SARS-CoV-2/FLU/RSV assay is intended as an aid in  the diagnosis of influenza from Nasopharyngeal swab specimens and  should not be used as a sole basis for treatment. Nasal washings and  aspirates are unacceptable for Xpert Xpress SARS-CoV-2/FLU/RSV  testing.  Fact Sheet for Patients: https://www.young.biz/  Fact Sheet for Healthcare Providers: [place]  This test is not yet approved or cleared by the https://www.moore.com/ FDA  and  has been authorized for detection and/or diagnosis of SARS-CoV-2 by  FDA under an Emergency Use Authorization (EUA). This EUA will remain  in effect (meaning this test can be used) for the duration of the  Covid-19 declaration under Section 564(b)(1) of the Act, 21  U.S.C. section 360bbb-3(b)(1), unless the authorization is  terminated or revoked. Performed at [facility], [address], [place], [place] [address]          Radiology Studies: DG Finger Middle Right  Result Date: 09/22/2020 CLINICAL DATA:  79 year old female with swelling of the third digit of the right hand. EXAM: RIGHT MIDDLE FINGER 2+V COMPARISON:  Radiograph dated 08/29/2020. FINDINGS: No acute  fracture. The bones are osteopenic. There is no dislocation. There is loss of DIP joint space likely infectious or inflammatory in etiology. There is irregularity of the lateral aspect of the the IP joint. Overall the appearance is similar to prior radiograph. There is mild soft tissue swelling. No radiopaque foreign object or soft tissue gas. IMPRESSION: 1. No acute fracture or dislocation. 2. Arthritic changes of the DIP joints. 3. Mild soft tissue swelling of the third digit. Electronically Signed   By: Elgie Collard M.D.   On: 09/22/2020 23:30        Scheduled Meds:  sodium chloride flush  3 mL Intravenous Q12H   Continuous Infusions:  sodium chloride     vancomycin            Glade Lloyd, MD [facility] 09/23/2020, 8:03 AM

## 2020-09-23 NOTE — Plan of Care (Signed)
  Problem: Education: Goal: Knowledge of General Education information will improve Description Including pain rating scale, medication(s)/side effects and non-pharmacologic comfort measures Outcome: Progressing   

## 2020-09-23 NOTE — Care Management CC44 (Signed)
Condition Code 44 Documentation Completed  Patient Details  Name: Dominique Peterson MRN: [id number] Date of Birth: 06/19/1941   Condition Code 44 given:   Yes Patient signature on Condition Code 44 notice:   Yes Documentation of 2 MD's agreement:   Yes Code 44 added to claim:   Yes    Isaias Cowman, RN 09/23/2020, 3:14 PM

## 2020-09-23 NOTE — Progress Notes (Signed)
DISCHARGE NOTE HOME Chane Magner to be discharged home per MD order. Discussed prescriptions and follow up appointments with the patient. Prescriptions given to patient; medication list explained in detail. Patient verbalized understanding.  Skin clean, dry and intact without evidence of skin break down, no evidence of skin tears noted. IV catheter discontinued intact. Site without signs and symptoms of complications. Dressing and pressure applied. Pt denies pain at the site currently. No complaints noted.  Patient free of lines, drains, and wounds.   An After Visit Summary (AVS) was printed and given to the patient. Patient escorted via wheelchair, and discharged home via private auto.  Vaughan Garfinkle S Analycia Khokhar, RN

## 2020-09-23 NOTE — Care Management Obs Status (Signed)
MEDICARE OBSERVATION STATUS NOTIFICATION   Patient Details  Name: Dominique Peterson MRN: [id number] Date of Birth: 1941-08-02   Medicare Observation Status Notification Given:  Yes    Isaias Cowman, RN 09/23/2020, 3:17 PM

## 2020-09-23 NOTE — Care Management Obs Status (Deleted)
MEDICARE OBSERVATION STATUS NOTIFICATION   Patient Details  Name: Dominique Peterson MRN: [id number] Date of Birth: [birth year]   Medicare Observation Status Notification Given:  Yes    Isaias Cowman, RN 09/23/2020, 3:35 PM

## 2020-09-23 NOTE — Consult Note (Signed)
Dominique Peterson for Infectious Disease       Reason for Consult: ? osteomyelitis    Referring Physician: Dr. Starla Link  Principal Problem:   Finger osteomyelitis, right [facility]) Active Problems:   Hypertension   . sodium chloride flush  3 mL Intravenous Q12H    Recommendations: NSAIDS She will follow up with Dr. Amedeo Plenty in about 2 weeks, will call for sooner appt if worsening.  Discussed with Dr. Amedeo Plenty   Will follow up with ID as needed  Assessment: She has some inflammation of her finger, no pain, no warmth, improvement of movement and inflammatory markers all wnl.  MRI findings aside, this is c/w non-infectious inflammatory condition.  My exam and review of the findings do not warrant    Antibiotics: vancomycin  HPI: Dominique Peterson is a 79 y.o. female with history of OA who was seen by sports medicine at [facility], Dr. Darene Lamer and concern for osteomyelitis.  She initially began to have issues over 1 month ago with swelling and decreased movement.  She was given doxyccyline and later Bactrim and had some mild improvement but persisted overall.  She was seen and work up revealed a normal CRP, ESR but MRI was done and concerning for osteomyelitis.  She was sent in for evaluation by Dr. Amedeo Plenty and no surgery planned.  Xray most c/w arthritis changes.     Review of Systems:  Constitutional: negative for fevers and chills Gastrointestinal: negative for diarrhea All other systems reviewed and are negative    Past Medical History:  Diagnosis Date  . Infection 09/22/2020   3rd right finger    Social History   Tobacco Use  . Smoking status: Never Smoker  . Smokeless tobacco: Never Used  Vaping Use  . Vaping Use: Never used  Substance Use Topics  . Alcohol use: Never  . Drug use: Never    Family History  Problem Relation Age of Onset  . Heart failure Mother     No Known Allergies  Physical Exam: Constitutional: in no apparent distress  Vitals:   09/23/20 0611 09/23/20  1041  BP: (!) 129/50 (!) 156/63  Pulse: (!) 58 (!) 55  Resp: 16 18  Temp: 97.7 F (36.5 C) 98.5 F (36.9 C)  SpO2: 96% 100%   EYES: anicteric Musculoskeletal: finger with no warmth, no tenderness, some edema and erythema. Some increased movement.   Skin: no rash  Lab Results  Component Value Date   WBC 6.1 09/23/2020   HGB 11.1 (L) 09/23/2020   HCT 35.6 (L) 09/23/2020   MCV 92.5 09/23/2020   PLT 220 09/23/2020    Lab Results  Component Value Date   CREATININE 0.78 09/23/2020   BUN 13 09/23/2020   NA 140 09/23/2020   K 3.7 09/23/2020   CL 104 09/23/2020   CO2 27 09/23/2020    Lab Results  Component Value Date   ALT 28 09/22/2020   AST 25 09/22/2020     Microbiology: Recent Results (from the past 240 hour(s))  Respiratory Panel by RT PCR (Flu A&B, Covid) - Nasopharyngeal Swab     Status: None   Collection Time: 09/22/20 10:46 PM   Specimen: Nasopharyngeal Swab  Result Value Ref Range Status   SARS Coronavirus 2 by RT PCR NEGATIVE NEGATIVE Final    Comment: (NOTE) SARS-CoV-2 target nucleic acids are NOT DETECTED.  The SARS-CoV-2 RNA is generally detectable in upper respiratoy specimens during the acute phase of infection. The lowest concentration of SARS-CoV-2 viral copies  this assay can detect is 131 copies/mL. A negative result does not preclude SARS-Cov-2 infection and should not be used as the sole basis for treatment or other patient management decisions. A negative result may occur with  improper specimen collection/handling, submission of specimen other than nasopharyngeal swab, presence of viral mutation(s) within the areas targeted by this assay, and inadequate number of viral copies (<131 copies/mL). A negative result must be combined with clinical observations, patient history, and epidemiological information. The expected result is Negative.  Fact Sheet for Patients:  PinkCheek.be  Fact Sheet for Healthcare Providers:   GravelBags.it  This test is no t yet approved or cleared by the [place] FDA and  has been authorized for detection and/or diagnosis of SARS-CoV-2 by FDA under an Emergency Use Authorization (EUA). This EUA will remain  in effect (meaning this test can be used) for the duration of the COVID-19 declaration under Section 564(b)(1) of the Act, 21 U.S.C. section 360bbb-3(b)(1), unless the authorization is terminated or revoked sooner.     Influenza A by PCR NEGATIVE NEGATIVE Final   Influenza B by PCR NEGATIVE NEGATIVE Final    Comment: (NOTE) The Xpert Xpress SARS-CoV-2/FLU/RSV assay is intended as an aid in  the diagnosis of influenza from Nasopharyngeal swab specimens and  should not be used as a sole basis for treatment. Nasal washings and  aspirates are unacceptable for Xpert Xpress SARS-CoV-2/FLU/RSV  testing.  Fact Sheet for Patients: PinkCheek.be  Fact Sheet for Healthcare Providers: GravelBags.it  This test is not yet approved or cleared by the [place] FDA and  has been authorized for detection and/or diagnosis of SARS-CoV-2 by  FDA under an Emergency Use Authorization (EUA). This EUA will remain  in effect (meaning this test can be used) for the duration of the  Covid-19 declaration under Section 564(b)(1) of the Act, 21  U.S.C. section 360bbb-3(b)(1), unless the authorization is  terminated or revoked. Performed at [facility], [place] [address], [place], [place] [address]     Trany Chernick W Corday Wyka, [facility] [email or web address] 09/23/2020, 2:31 PM

## 2020-09-28 LAB — CULTURE, BLOOD (SINGLE)
MICRO NUMBER:: 11051005
MICRO NUMBER:: 11051006
Result:: NO GROWTH
SPECIMEN QUALITY:: ADEQUATE

## 2020-09-28 LAB — CBC WITH DIFFERENTIAL/PLATELET
Absolute Monocytes: 302 cells/uL (ref 200–950)
Basophils Absolute: 58 cells/uL (ref 0–200)
Basophils Relative: 1.1 %
Eosinophils Absolute: 80 cells/uL (ref 15–500)
Eosinophils Relative: 1.5 %
HCT: 39.5 % (ref 35.0–45.0)
Hemoglobin: 13.2 g/dL (ref 11.7–15.5)
Lymphs Abs: 970 cells/uL (ref 850–3900)
MCH: 29.8 pg (ref 27.0–33.0)
MCHC: 33.4 g/dL (ref 32.0–36.0)
MCV: 89.2 fL (ref 80.0–100.0)
MPV: 10.6 fL (ref 7.5–12.5)
Monocytes Relative: 5.7 %
Neutro Abs: 3890 cells/uL (ref 1500–7800)
Neutrophils Relative %: 73.4 %
Platelets: 264 10*3/uL (ref 140–400)
RBC: 4.43 10*6/uL (ref 3.80–5.10)
RDW: 13.4 % (ref 11.0–15.0)
Total Lymphocyte: 18.3 %
WBC: 5.3 10*3/uL (ref 3.8–10.8)

## 2020-09-28 LAB — COMPREHENSIVE METABOLIC PANEL
AG Ratio: 1.6 (calc) (ref 1.0–2.5)
ALT: 28 U/L (ref 6–29)
AST: 25 U/L (ref 10–35)
Albumin: 4.6 g/dL (ref 3.6–5.1)
Alkaline phosphatase (APISO): 94 U/L (ref 37–153)
BUN: 13 mg/dL (ref 7–25)
CO2: 28 mmol/L (ref 20–32)
Calcium: 9.6 mg/dL (ref 8.6–10.4)
Chloride: 102 mmol/L (ref 98–110)
Creat: 0.76 mg/dL (ref 0.60–0.93)
Globulin: 2.8 g/dL (calc) (ref 1.9–3.7)
Glucose, Bld: 102 mg/dL — ABNORMAL HIGH (ref 65–99)
Potassium: 4.4 mmol/L (ref 3.5–5.3)
Sodium: 140 mmol/L (ref 135–146)
Total Bilirubin: 0.5 mg/dL (ref 0.2–1.2)
Total Protein: 7.4 g/dL (ref 6.1–8.1)

## 2020-09-28 LAB — HEMOGLOBIN A1C
Hgb A1c MFr Bld: 5.5 % of total Hgb (ref <5.7)
Mean Plasma Glucose: 111 (calc)
eAG (mmol/L): 6.2 (calc)

## 2020-09-28 LAB — HIV ANTIBODY (ROUTINE TESTING W REFLEX): HIV 1&2 Ab, 4th Generation: NONREACTIVE

## 2020-09-28 LAB — C-REACTIVE PROTEIN: CRP: 3.5 mg/L (ref <8.0)

## 2020-09-28 LAB — SEDIMENTATION RATE: Sed Rate: 17 mm/h (ref 0–30)

## 2022-03-07 IMAGING — DX DG FINGER MIDDLE 2+V*R*
3 series · 3 of 3 positions shown · non-contrast
Comparison: None.
COMPARISON: None.

Addendum:
CLINICAL DATA: Persistent tenderness and pain and D IP for 1 month

EXAM:
RIGHT MIDDLE FINGER 2+V

[finger ap]
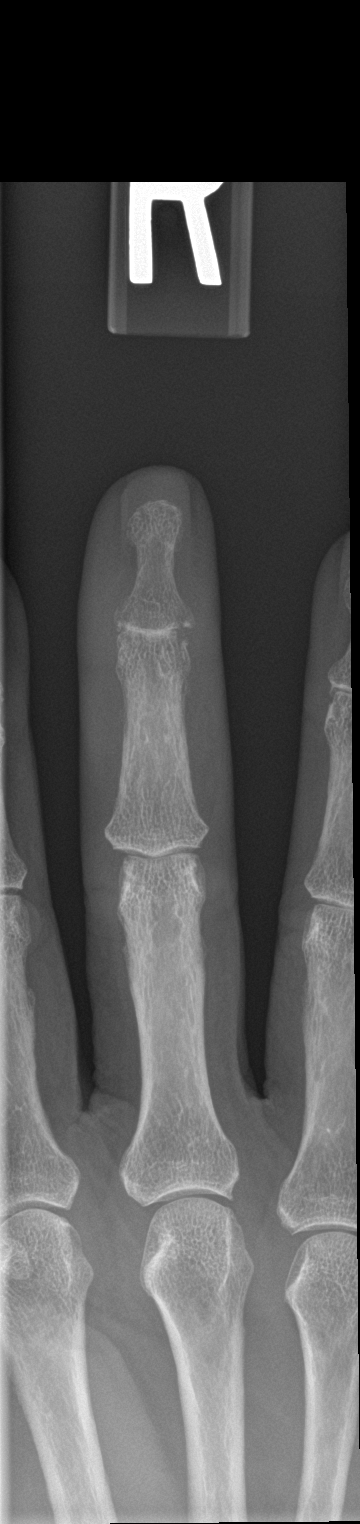

[finger obl]
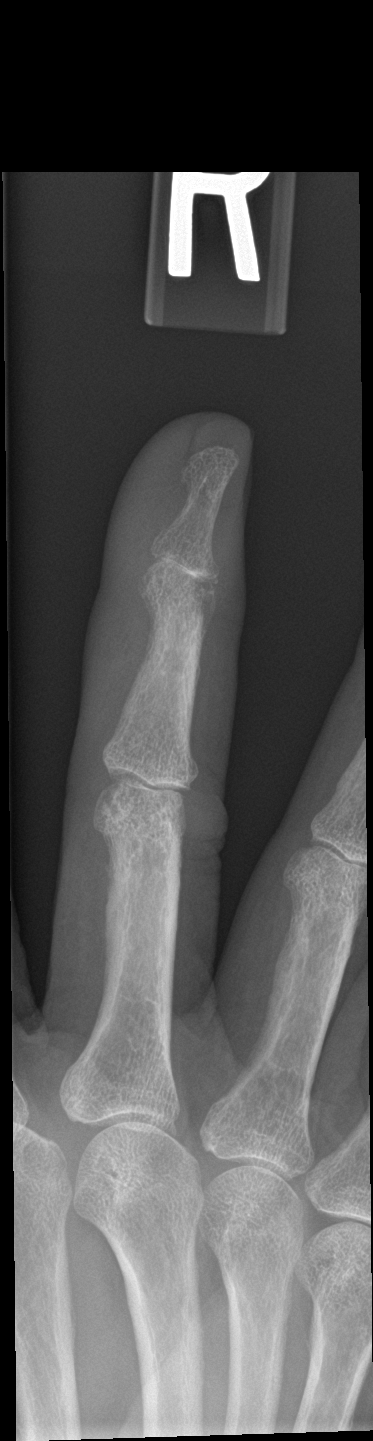

[finger lat]
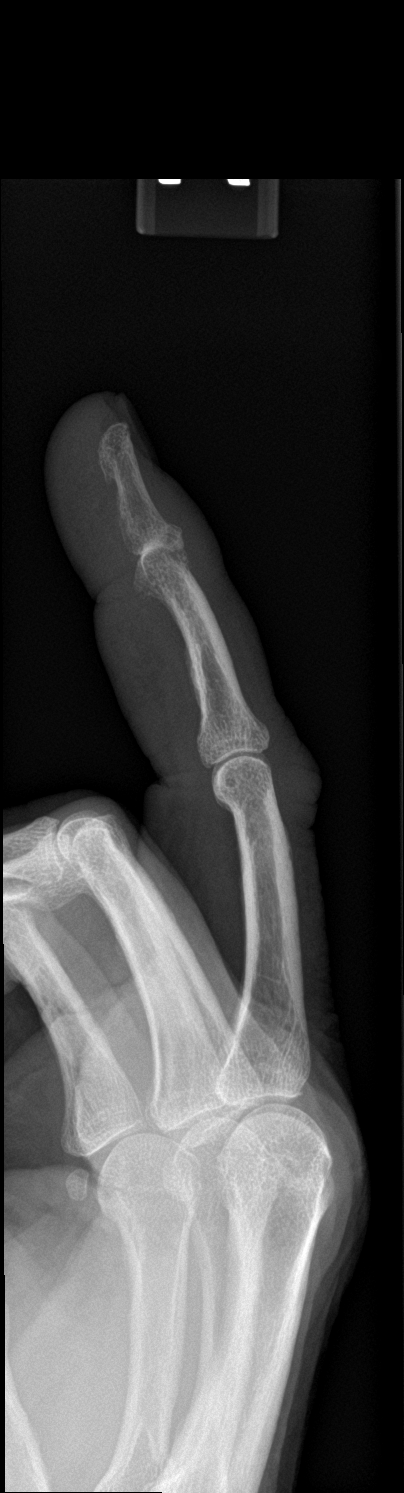

[3 of 3 positions shown; findings below may reference images not displayed]

FINDINGS: Subtle fractures involving the base of the third distal phalanx as
well as the head of the middle phalanx albeit with complete
effacement of the joint space and additional subcortical lucency and
swelling. While this could be posttraumatic edematous changes,
septic arthritis/osteomyelitis is not excluded given the surrounding
swelling. No soft tissue gas or foreign body.
IMPRESSION: Subtle fractures involving the base of the third distal phalanx as
well as the head of the middle phalanx albeit

Complete effacement of the distal interphalangeal joint space and
additional subcortical lucency and swelling, possibly posttraumatic
though should exclude features of infection clinically as septic
arthritis/osteomyelitis could present similarly.

ADDENDUM:
Transcription error: Impression should read

Possible fractures involving the base of the third distal phalanx
and head of the third middle phalanx although given effacement of
the distal interphalangeal joint space and subcortical lucency with
surrounding swelling should exclude clinical features of infection
as septic arthritis/osteomyelitis or an inflammatory process could
present similarly.

*** End of Addendum ***
FINDINGS: Subtle fractures involving the base of the third distal phalanx as
well as the head of the middle phalanx albeit with complete
effacement of the joint space and additional subcortical lucency and
swelling. While this could be posttraumatic edematous changes,
septic arthritis/osteomyelitis is not excluded given the surrounding
swelling. No soft tissue gas or foreign body.
IMPRESSION: Subtle fractures involving the base of the third distal phalanx as
well as the head of the middle phalanx albeit

Complete effacement of the distal interphalangeal joint space and
additional subcortical lucency and swelling, possibly posttraumatic
though should exclude features of infection clinically as septic
arthritis/osteomyelitis could present similarly.
